# Patient Record
Sex: Female | Born: 1945 | Race: White | Hispanic: No | Marital: Married | Smoking: Never smoker
Health system: Southern US, Community
[De-identification: ages and names within clinical notes are randomized; demographics above are authoritative.]

## PROBLEM LIST (undated history)

## (undated) DIAGNOSIS — M810 Age-related osteoporosis without current pathological fracture: Secondary | ICD-10-CM

## (undated) DIAGNOSIS — K5792 Diverticulitis of intestine, part unspecified, without perforation or abscess without bleeding: Secondary | ICD-10-CM

## (undated) DIAGNOSIS — H269 Unspecified cataract: Secondary | ICD-10-CM

## (undated) DIAGNOSIS — G14 Postpolio syndrome: Secondary | ICD-10-CM

## (undated) DIAGNOSIS — M1990 Unspecified osteoarthritis, unspecified site: Secondary | ICD-10-CM

## (undated) HISTORY — PX: SMALL INTESTINE SURGERY: SHX150

## (undated) HISTORY — PX: CATARACT EXTRACTION: SUR2

## (undated) HISTORY — PX: OOPHORECTOMY: SHX86

## (undated) HISTORY — PX: ILEOSTOMY: SHX1783

## (undated) HISTORY — PX: BACK SURGERY: SHX140

---

## 2000-09-23 ENCOUNTER — Ambulatory Visit (HOSPITAL_BASED_OUTPATIENT_CLINIC_OR_DEPARTMENT_OTHER): Admission: RE | Admit: 2000-09-23 | Discharge: 2000-09-23 | Payer: Self-pay | Admitting: Internal Medicine

## 2009-07-22 ENCOUNTER — Encounter: Admission: RE | Admit: 2009-07-22 | Discharge: 2009-10-20 | Payer: Self-pay | Admitting: Internal Medicine

## 2013-02-25 ENCOUNTER — Emergency Department (HOSPITAL_BASED_OUTPATIENT_CLINIC_OR_DEPARTMENT_OTHER): Payer: Medicare Other

## 2013-02-25 ENCOUNTER — Encounter (HOSPITAL_BASED_OUTPATIENT_CLINIC_OR_DEPARTMENT_OTHER): Payer: Self-pay | Admitting: Emergency Medicine

## 2013-02-25 ENCOUNTER — Emergency Department (HOSPITAL_BASED_OUTPATIENT_CLINIC_OR_DEPARTMENT_OTHER)
Admission: EM | Admit: 2013-02-25 | Discharge: 2013-02-25 | Disposition: A | Discharge status: Home / Self Care | Payer: Medicare Other | Attending: Emergency Medicine | Admitting: Emergency Medicine

## 2013-02-25 DIAGNOSIS — S52599A Other fractures of lower end of unspecified radius, initial encounter for closed fracture: Secondary | ICD-10-CM | POA: Insufficient documentation

## 2013-02-25 DIAGNOSIS — Z8719 Personal history of other diseases of the digestive system: Secondary | ICD-10-CM | POA: Insufficient documentation

## 2013-02-25 DIAGNOSIS — M129 Arthropathy, unspecified: Secondary | ICD-10-CM | POA: Insufficient documentation

## 2013-02-25 DIAGNOSIS — Z8612 Personal history of poliomyelitis: Secondary | ICD-10-CM | POA: Insufficient documentation

## 2013-02-25 DIAGNOSIS — Y929 Unspecified place or not applicable: Secondary | ICD-10-CM | POA: Insufficient documentation

## 2013-02-25 DIAGNOSIS — Y9389 Activity, other specified: Secondary | ICD-10-CM | POA: Insufficient documentation

## 2013-02-25 DIAGNOSIS — S52501A Unspecified fracture of the lower end of right radius, initial encounter for closed fracture: Secondary | ICD-10-CM

## 2013-02-25 DIAGNOSIS — W050XXA Fall from non-moving wheelchair, initial encounter: Secondary | ICD-10-CM | POA: Insufficient documentation

## 2013-02-25 DIAGNOSIS — Z8669 Personal history of other diseases of the nervous system and sense organs: Secondary | ICD-10-CM | POA: Insufficient documentation

## 2013-02-25 HISTORY — DX: Unspecified osteoarthritis, unspecified site: M19.90

## 2013-02-25 HISTORY — DX: Diverticulitis of intestine, part unspecified, without perforation or abscess without bleeding: K57.92

## 2013-02-25 HISTORY — DX: Unspecified cataract: H26.9

## 2013-02-25 HISTORY — DX: Age-related osteoporosis without current pathological fracture: M81.0

## 2013-02-25 HISTORY — DX: Postpolio syndrome: G14

## 2013-02-25 NOTE — ED Notes (Signed)
Pt reports she slipped while getting out of recliner and fell landing on right wrist

## 2013-02-25 NOTE — ED Provider Notes (Addendum)
CSN: [id number]     Arrival date & time 02/25/13  1810 History   First MD Initiated Contact with Patient 02/25/13 1914     Chief Complaint  Patient presents with  . Fall   (Consider location/radiation/quality/duration/timing/severity/associated sxs/prior Treatment) HPI Comments: 67 year old female presents after a fall of her wheelchair because her to land on her right wrist. Patient states she's to get up and is not sure she twisted on her legs were caught part of her wheelchair and fell. She denies any headaches, nausea, vomiting, chest pain, or dizziness. Denies any dyspnea. She she never felt lightheaded or dizzy. She felt like maybe her right leg "gave out on her". Immediately after states her right leg feels like his to his normal baseline. She has had some swelling to her right wrist denies any weakness or numbness.   Past Medical History  Diagnosis Date  . Post-polio syndrome   . Diverticulitis   . Cataract   . Arthritis   . Osteoporosis    Past Surgical History  Procedure Laterality Date  . Ileostomy    . Small intestine surgery    . Back surgery    . Oophorectomy    . Cataract extraction     No family history on file. History  Substance Use Topics  . Smoking status: Never Smoker   . Smokeless tobacco: Never Used  . Alcohol Use: No   OB History   Grav Para Term Preterm Abortions TAB SAB Ect Mult Living                 Review of Systems  Constitutional: Negative for fever.  Respiratory: Negative for shortness of breath.   Cardiovascular: Negative for chest pain.  Gastrointestinal: Negative for vomiting.  Musculoskeletal: Positive for joint swelling.  Skin: Negative for wound.  Neurological: Negative for dizziness, syncope, weakness, light-headedness, numbness and headaches.  All other systems reviewed and are negative.    Allergies  Aspirin; Codeine; and Nsaids  Home Medications   Current Outpatient Rx  Name  Route  Sig  Dispense  Refill  .  acetaminophen (TYLENOL) 650 MG CR tablet   Oral   Take 650 mg by mouth every 8 (eight) hours as needed for pain.         . Artificial Tear Ointment (ARTIFICIAL TEARS) ointment      as needed.         . baclofen (LIORESAL) 10 MG tablet   Oral   Take 5 mg by mouth every 4 (four) hours as needed for muscle spasms.         . diphenhydrAMINE (SOMINEX) 25 MG tablet   Oral   Take 25 mg by mouth at bedtime as needed for sleep.         Marland Kitchen loratadine (CLARITIN) 10 MG tablet   Oral   Take 10 mg by mouth daily as needed for allergies.         . Multiple Vitamins-Minerals (MULTIVITAMIN WITH MINERALS) tablet   Oral   Take 1 tablet by mouth daily.         . ondansetron (ZOFRAN) 4 MG tablet   Oral   Take 4 mg by mouth every 8 (eight) hours as needed for nausea or vomiting.         . Oxycodone HCl 10 MG TABS   Oral   Take 10 mg by mouth every 4 (four) hours.         . traMADol (ULTRAM) 50 MG tablet  Oral   Take by mouth every 6 (six) hours as needed.         . Vitamin D, Ergocalciferol, (DRISDOL) 50000 UNITS CAPS capsule   Oral   Take 50,000 Units by mouth every 7 (seven) days.          BP 122/73  Pulse 92  Temp(Src) 97.9 F (36.6 C) (Oral)  Resp 16  Ht 5\' 3"  (1.6 m)  Wt 130 lb (58.968 kg)  BMI 23.03 kg/m2  SpO2 100% Physical Exam  Nursing note and vitals reviewed. Constitutional: She is oriented to person, place, and time. No distress.  HENT:  Head: Normocephalic and atraumatic.  Right Ear: External ear normal.  Left Ear: External ear normal.  Nose: Nose normal.  Eyes: Right eye exhibits no discharge. Left eye exhibits no discharge.  Neck: Normal range of motion. No spinous process tenderness and no muscular tenderness present.  Cardiovascular: Normal rate, regular rhythm and normal heart sounds.   Pulses:      Radial pulses are 2+ on the right side, and 2+ on the left side.  Pulmonary/Chest: Effort normal and breath sounds normal.  Abdominal: Soft.  There is no tenderness.  Musculoskeletal:       Right elbow: She exhibits normal range of motion and no swelling. No tenderness found.       Right wrist: She exhibits tenderness, bony tenderness and swelling.  Neurological: She is alert and oriented to person, place, and time. She has normal strength. No sensory deficit.  Equal lower strandy strength at her baseline  Skin: Skin is warm and dry.    ED Course  Procedures (including critical care time) Labs Review Labs Reviewed - No data to display Imaging Review Dg Forearm Right  02/25/2013   CLINICAL DATA:  Fall with injury to distal right forearm.  EXAM: RIGHT FOREARM - 2 VIEW  COMPARISON:  None.  FINDINGS: Two view exam of the right forearm shows a comminuted fracture of the distal radial metaphysis. Main fracture line is oriented in a transverse plane and no definite extension to the distal articular surface is evident. No associated ulnar fracture is apparent.  IMPRESSION: Transverse fracture of the distal radial metaphysis with no definite extension to the articular surface visible on this exam.   Electronically Signed   By: Kennith Center M.D.   On: 02/25/2013 19:35    EKG Interpretation   None       MDM   1. Distal radius fracture, right, closed, initial encounter    Patient's fall is consistent with a mechanical fall. I have a low suspicion for a stroke, syncope, or other cardiac events. Patient feels that her baseline does have swelling. She normally takes 10 mg oxycodone and feels this will be sufficient for her pain. She was given one of these in the ED. Her arm is neurovascularly intact. We'll splint and refer to orthopedics.    Audree Camel, MD 02/25/13 2003  Audree Camel, MD 02/25/13 2003

## 2013-02-25 NOTE — ED Notes (Signed)
Pt informed me on discharge that she took her own Oxycodone IR 10mg  tablet.

## 2013-02-25 NOTE — ED Notes (Signed)
I applied radial gutter typ splint with fiberglass material, I then emptied ostomy bag for patient.

## 2014-12-11 IMAGING — CR DG FOREARM 2V*R*
1 series · 1 of 1 positions shown · non-contrast
Comparison: None.

CLINICAL DATA: Fall with injury to distal right forearm.

EXAM:
RIGHT FOREARM - 2 VIEW

[x forearm ap right]
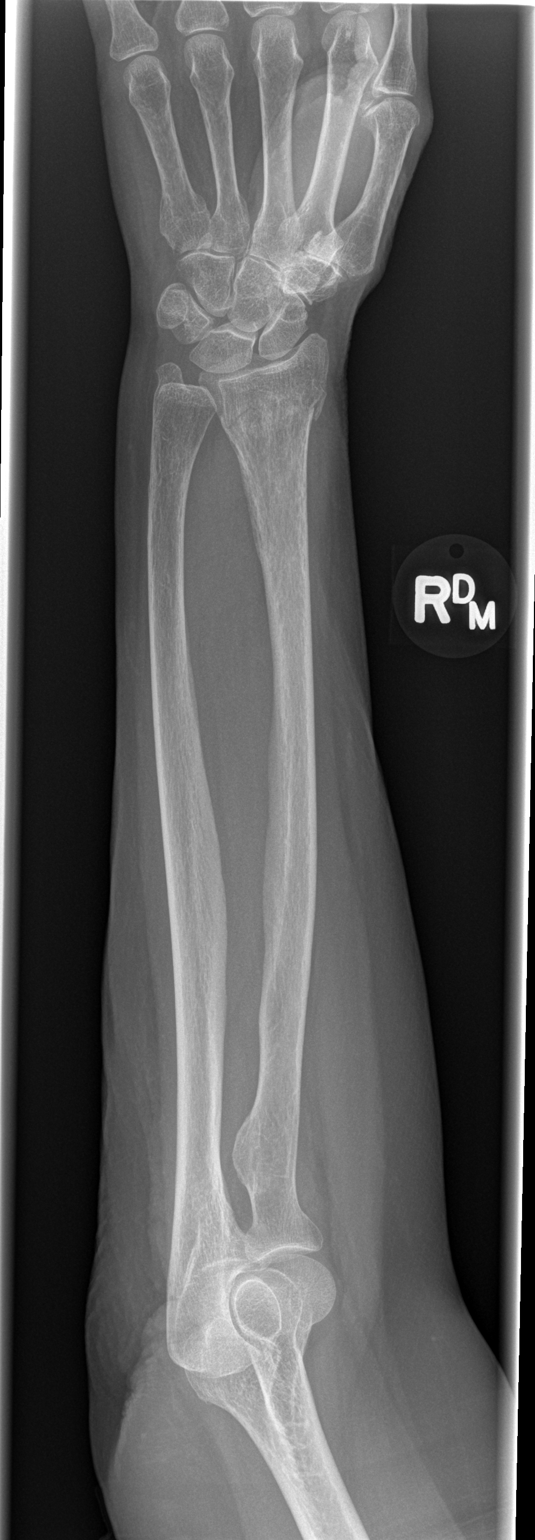

[1 of 1 positions shown; findings below may reference images not displayed]

FINDINGS: Two view exam of the right forearm shows a comminuted fracture of
the distal radial metaphysis. Main fracture line is oriented in a
transverse plane and no definite extension to the distal articular
surface is evident. No associated ulnar fracture is apparent.
IMPRESSION: Transverse fracture of the distal radial metaphysis with no definite
extension to the articular surface visible on this exam.

## 2015-02-07 ENCOUNTER — Ambulatory Visit (HOSPITAL_BASED_OUTPATIENT_CLINIC_OR_DEPARTMENT_OTHER): Payer: Medicare Other | Attending: Physician Assistant | Admitting: Radiology

## 2015-02-07 VITALS — Ht 63.0 in | Wt 210.0 lb

## 2015-02-07 DIAGNOSIS — R5383 Other fatigue: Secondary | ICD-10-CM | POA: Diagnosis not present

## 2015-02-07 DIAGNOSIS — E669 Obesity, unspecified: Secondary | ICD-10-CM | POA: Insufficient documentation

## 2015-02-07 DIAGNOSIS — G14 Postpolio syndrome: Secondary | ICD-10-CM | POA: Diagnosis present

## 2015-02-07 DIAGNOSIS — Z79899 Other long term (current) drug therapy: Secondary | ICD-10-CM | POA: Diagnosis not present

## 2015-02-07 DIAGNOSIS — Z6837 Body mass index (BMI) 37.0-37.9, adult: Secondary | ICD-10-CM | POA: Insufficient documentation

## 2015-02-07 DIAGNOSIS — G4730 Sleep apnea, unspecified: Secondary | ICD-10-CM | POA: Insufficient documentation

## 2015-02-07 DIAGNOSIS — I493 Ventricular premature depolarization: Secondary | ICD-10-CM | POA: Diagnosis not present

## 2015-02-07 DIAGNOSIS — G4700 Insomnia, unspecified: Secondary | ICD-10-CM | POA: Diagnosis not present

## 2015-02-07 DIAGNOSIS — R0683 Snoring: Secondary | ICD-10-CM | POA: Diagnosis not present

## 2015-02-09 DIAGNOSIS — G14 Postpolio syndrome: Secondary | ICD-10-CM | POA: Diagnosis not present

## 2015-02-09 DIAGNOSIS — G4730 Sleep apnea, unspecified: Secondary | ICD-10-CM | POA: Diagnosis not present

## 2015-02-09 NOTE — Progress Notes (Signed)
   Patient Name: Nicole Tanner, Jury Study Date: 02/07/2015 Gender: Female D.O.B: September 13, 1945 Age (years): [age] Referring Provider: Lysle PearlJustin Blaylock Height (inches): 63 Interpreting Physician: Jetty Duhamellinton Milina Pagett MD, ABSM Weight (lbs): 210 RPSGT: Shelah LewandowskyGregory, Kenyon BMI: 37 MRN: [id number] Neck Size: 14.50   CLINICAL INFORMATION  Sleep Study Type: NPSG Indication for sleep study: Fatigue, Obesity, Re-Evaluation, Restless Sleep with Limb Movments, Snoring Epworth Sleepiness Score: 4  SLEEP STUDY TECHNIQUE  As per the AASM Manual for the Scoring of Sleep and Associated Events v2.3 (April 2016) with a hypopnea requiring 4% desaturations. The channels recorded and monitored were frontal, central and occipital EEG, electrooculogram (EOG), submentalis EMG (chin), nasal and oral airflow, thoracic and abdominal wall motion, anterior tibialis EMG, snore microphone, electrocardiogram, and pulse oximetry.  MEDICATIONS  Patient's medications include: charted for review. Medications self-administered by patient during sleep study : Baclofen, Tylenol Arthritis  SLEEP ARCHITECTURE  The study was initiated at 11:22:11 PM and ended at 5:32:24 AM. Sleep onset time was 40.2 minutes and the sleep efficiency was 30.9%. The total sleep time was 114.5 minutes. Stage REM latency was 254.0 minutes. The patient spent 16.59% of the night in stage N1 sleep, 79.48% in stage N2 sleep, 0.00% in stage N3 and 3.93% in REM. Alpha intrusion was absent. Supine sleep was 0.00%. Wake after sleep onset 215.5 minutes  RESPIRATORY PARAMETERS The overall apnea/hypopnea index (AHI) was 3.7 per hour. There were 2 total apneas, including 0 obstructive, 0 central and 2 mixed apneas. There were 5 hypopneas and 19 RERAs. The AHI during Stage REM sleep was 0.0 per hour. AHI while supine was N/A per hour. The mean oxygen saturation was 90.93%. The minimum SpO2 during sleep was 88.00%. Soft snoring was noted during this study.  CARDIAC DATA  The 2  lead EKG demonstrated sinus rhythm. The mean heart rate was 82.06 beats per minute. Other EKG findings include: PVCs.  LEG MOVEMENT DATA  The total PLMS were 1 with a resulting PLMS index of 0.52. Associated arousal with leg movement index was 0.5.  IMPRESSIONS No significant obstructive sleep apnea occurred during this study (AHI = 3.7/h). No significant central sleep apnea occurred during this study (CAI = 0.0/h). Mild oxygen desaturation was noted during this study (Min O2 = 88.00%). The patient snored with Soft snoring volume. EKG findings include PVCs. with multifocal, couplets, bigeminy, trigeminy Clinically significant periodic limb movements did not occur during sleep. No significant associated arousals. Significant difficulty initiating and maintaining sleep, partly due to discomfort. The patient changed from sleep in   recliner to sleep in her motorized wheelchair.  DIAGNOSIS  Insomnia- difficulty initiating and maintaining sleep  RECOMMENDATIONS  Avoid alcohol, sedatives and other CNS depressants that may worsen sleep apnea and disrupt normal sleep architecture. Sleep hygiene should be reviewed to assess factors that may improve sleep quality. Weight management and regular exercise should be initiated or continued if appropriate. Attention to cardiac rhythm- PVCs  Waymon BudgeYOUNG,Cailen Mihalik D Diplomate, American Board of Sleep Medicine  ELECTRONICALLY SIGNED ON:  02/09/2015, 11:07 AM [facility] PH: [phone]   FX: [phone] ACCREDITED BY THE AMERICAN ACADEMY OF SLEEP MEDICINE

## 2022-07-21 DEATH — deceased
